# Patient Record
Sex: Female | Born: 2000 | Hispanic: No | Marital: Single | Smoking: Never smoker
Health system: Southern US, Community
[De-identification: ages and names within clinical notes are randomized; demographics above are authoritative.]

## PROBLEM LIST (undated history)

## (undated) HISTORY — PX: NO PAST SURGERIES: SHX2092

---
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
## 2023-08-26 ENCOUNTER — Ambulatory Visit: Admission: EM | Admit: 2023-08-26 | Payer: Self-pay | Source: Home / Self Care

## 2023-10-18 ENCOUNTER — Ambulatory Visit
Admission: EM | Admit: 2023-10-18 | Discharge: 2023-10-18 | Disposition: A | Discharge status: Home / Self Care | Payer: Self-pay | Attending: Physician Assistant | Admitting: Physician Assistant

## 2023-10-18 DIAGNOSIS — J069 Acute upper respiratory infection, unspecified: Secondary | ICD-10-CM

## 2023-10-18 MED ORDER — PROMETHAZINE-DM 6.25-15 MG/5ML PO SYRP: 5.0000 mL | ORAL_SOLUTION | Freq: Four times a day (QID) | ORAL | 0 refills | Status: DC | PRN

## 2023-10-18 NOTE — ED Provider Notes (Signed)
[facility]    CSN: [id number] Arrival date & time: 10/18/23  1354      History   Chief Complaint Chief Complaint  Patient presents with   Cough   Congestion    HPI Natasha Sandoval is a 22 y.o. female presenting for approximately 5-day history of fatigue, cough, congestion and slight sore throat.  Reports a fever the first day but none since.  Denies ear pain, sinus pain, chest pain, wheezing, shortness of breath, weakness, vomiting, abdominal pain, diarrhea.  No sick contacts.  Has been taking DayQuil and NyQuil without relief.  Reports feeling the same as when her symptoms began.  No better or worse.  No history of asthma.  No other complaints.  HPI  History reviewed. No pertinent past medical history.  There are no problems to display for this patient.   Past Surgical History:  Procedure Laterality Date   NO PAST SURGERIES      OB History   No obstetric history on file.      Home Medications    Prior to Admission medications   Medication Sig Start Date End Date Taking? Authorizing Provider  promethazine-dextromethorphan (PROMETHAZINE-DM) 6.25-15 MG/5ML syrup Take 5 mLs by mouth 4 (four) times daily as needed. 10/18/23  Yes Shirlee Latch PA-C    Family History History reviewed. No pertinent family history.  Social History Social History   Tobacco Use   Smoking status: Never   Smokeless tobacco: Never  Vaping Use   Vaping status: Never Used  Substance Use Topics   Alcohol use: Never   Drug use: Never     Allergies   Patient has no known allergies.   Review of Systems Review of Systems  Constitutional:  Positive for fatigue. Negative for chills, diaphoresis and fever.  HENT:  Positive for congestion, rhinorrhea and sore throat. Negative for ear pain, sinus pressure and sinus pain.   Respiratory:  Positive for cough. Negative for shortness of breath.   Cardiovascular:  Negative for chest pain.  Gastrointestinal:  Negative for  abdominal pain, nausea and vomiting.  Musculoskeletal:  Negative for arthralgias and myalgias.  Skin:  Negative for rash.  Neurological:  Negative for weakness and headaches.  Hematological:  Negative for adenopathy.     Physical Exam Triage Vital Signs ED Triage Vitals  Encounter Vitals Group     BP 10/18/23 1456 93/65     Systolic BP Percentile --      Diastolic BP Percentile --      Pulse Rate 10/18/23 1456 75     Resp 10/18/23 1456 16     Temp 10/18/23 1456 98.6 F (37 C)     Temp Source 10/18/23 1456 Oral     SpO2 10/18/23 1456 95 %     Weight 10/18/23 1456 100 lb (45.4 kg)     Height 10/18/23 1456 4\' 10"  (1.473 m)     Head Circumference --      Peak Flow --      Pain Score 10/18/23 1458 0     Pain Loc --      Pain Education --      Exclude from Growth Chart --    No data found.  Updated Vital Signs BP 93/65 (BP Location: Left Arm)   Pulse 75   Temp 98.6 F (37 C) (Oral)   Resp 16   Ht 4\' 10"  (1.473 m)   Wt 100 lb (45.4 kg)   LMP 09/28/2023 (Approximate)   SpO2 95%  BMI 20.90 kg/m      Physical Exam Vitals and nursing note reviewed.  Constitutional:      General: She is not in acute distress.    Appearance: Normal appearance. She is not ill-appearing or toxic-appearing.  HENT:     Head: Normocephalic and atraumatic.     Right Ear: Tympanic membrane, ear canal and external ear normal.     Left Ear: Tympanic membrane, ear canal and external ear normal.     Nose: Congestion present.     Mouth/Throat:     Mouth: Mucous membranes are moist.     Pharynx: Oropharynx is clear.  Eyes:     General: No scleral icterus.       Right eye: No discharge.        Left eye: No discharge.     Conjunctiva/sclera: Conjunctivae normal.  Cardiovascular:     Rate and Rhythm: Normal rate and regular rhythm.     Heart sounds: Normal heart sounds.  Pulmonary:     Effort: Pulmonary effort is normal. No respiratory distress.     Breath sounds: Normal breath sounds. No  wheezing, rhonchi or rales.  Musculoskeletal:     Cervical back: Neck supple.  Skin:    General: Skin is dry.  Neurological:     General: No focal deficit present.     Mental Status: She is alert. Mental status is at baseline.     Motor: No weakness.     Gait: Gait normal.  Psychiatric:        Mood and Affect: Mood normal.        Behavior: Behavior normal.        Thought Content: Thought content normal.      UC Treatments / Results  Labs (all labs ordered are listed, but only abnormal results are displayed) Labs Reviewed - No data to display  EKG   Radiology No results found.  Procedures Procedures (including critical care time)  Medications Ordered in UC Medications - No data to display  Initial Impression / Assessment and Plan / UC Course  I have reviewed the triage vital signs and the nursing notes.  Pertinent labs & imaging results that were available during my care of the patient were reviewed by me and considered in my medical decision making (see chart for details).   22 year old female presents for 5-day history of cough, congestion and mild sore throat.  Denies fever, sinus pain, ear pain, shortness of breath.  Vitals normal and stable.  Patient overall well-appearing.  Slight nasal congestion.  No ear infection.  Throat is clear.  Chest clear to auscultation in 4 listening fields.  Low suspicion for bronchitis or pneumonia.  Symptoms consistent with viral upper respiratory infection.  Advised that symptoms can last for a couple of weeks.  Advised that she should return if no improvement after 10 days, fever, sinus pain, ear pain, worsening sore throat, chest pain, shortness of breath.  Sent Promethazine DM to pharmacy and advised for care.   Final Clinical Impressions(s) / UC Diagnoses   Final diagnoses:  Viral URI with cough     Discharge Instructions      URI/COLD SYMPTOMS: Your exam today is consistent with a viral illness. Antibiotics are not  indicated at this time. Use medications as directed, including cough syrup, nasal saline, and decongestants. Your symptoms should improve over the next few days and resolve within 7-10 days. Increase rest and fluids. F/u if symptoms worsen or predominate such as sore  throat, ear pain, productive cough, shortness of breath, or if you develop high fevers or worsening fatigue over the next several days.       ED Prescriptions     Medication Sig Dispense Auth. Provider   promethazine-dextromethorphan (PROMETHAZINE-DM) 6.25-15 MG/5ML syrup Take 5 mLs by mouth 4 (four) times daily as needed. 118 mL Shirlee Latch, PA-C      PDMP not reviewed this encounter.   Shirlee Latch, PA-C 10/18/23 1605

## 2023-10-18 NOTE — ED Triage Notes (Signed)
Pt c/o cough & congestion x5 days. Denies any fevers. Has tried dayquil & nyquil w/o relief. Denies any SOB.

## 2023-10-18 NOTE — Discharge Instructions (Signed)
URI/COLD SYMPTOMS: Your exam today is consistent with a viral illness. Antibiotics are not indicated at this time. Use medications as directed, including cough syrup, nasal saline, and decongestants. Your symptoms should improve over the next few days and resolve within 7-10 days. Increase rest and fluids. F/u if symptoms worsen or predominate such as sore throat, ear pain, productive cough, shortness of breath, or if you develop high fevers or worsening fatigue over the next several days.    

## 2023-10-22 ENCOUNTER — Ambulatory Visit
Admission: EM | Admit: 2023-10-22 | Discharge: 2023-10-22 | Disposition: A | Discharge status: Home / Self Care | Payer: Self-pay | Attending: Emergency Medicine | Admitting: Emergency Medicine

## 2023-10-22 ENCOUNTER — Encounter: Payer: Self-pay | Admitting: Emergency Medicine

## 2023-10-22 DIAGNOSIS — J209 Acute bronchitis, unspecified: Secondary | ICD-10-CM

## 2023-10-22 MED ORDER — FLUTICASONE PROPIONATE 50 MCG/ACT NA SUSP: 1.0000 | Freq: Two times a day (BID) | NASAL | 1 refills | Status: AC

## 2023-10-22 MED ORDER — PREDNISONE 10 MG PO TABS: 10.0000 mg | ORAL_TABLET | Freq: Every day | ORAL | 0 refills | Status: DC

## 2023-10-22 MED ORDER — BENZONATATE 100 MG PO CAPS: 100.0000 mg | ORAL_CAPSULE | Freq: Three times a day (TID) | ORAL | 0 refills | Status: DC | PRN

## 2023-10-22 NOTE — ED Provider Notes (Signed)
[facility]    CSN: [id number] Arrival date & time: 10/22/23  1552      History   Chief Complaint Chief Complaint  Patient presents with   Nasal Congestion   Cough    HPI Natasha Sandoval is a 22 y.o. female.   Patient was seen on the 21st.  She was diagnosed with cold cough.  She continues with mucus production and excessive cough.   Cough   History reviewed. No pertinent past medical history.  There are no problems to display for this patient.   Past Surgical History:  Procedure Laterality Date   NO PAST SURGERIES      OB History   No obstetric history on file.      Home Medications    Prior to Admission medications   Medication Sig Start Date End Date Taking? Authorizing Provider  promethazine-dextromethorphan (PROMETHAZINE-DM) 6.25-15 MG/5ML syrup Take 5 mLs by mouth 4 (four) times daily as needed. 10/18/23   Shirlee Latch, PA-C    Family History History reviewed. No pertinent family history.  Social History Social History   Tobacco Use   Smoking status: Never   Smokeless tobacco: Never  Vaping Use   Vaping status: Never Used  Substance Use Topics   Alcohol use: Never   Drug use: Never     Allergies   Patient has no known allergies.   Review of Systems Review of Systems  Respiratory:  Positive for cough.      Physical Exam Triage Vital Signs ED Triage Vitals  Encounter Vitals Group     BP 10/22/23 1617 107/62     Systolic BP Percentile --      Diastolic BP Percentile --      Pulse Rate 10/22/23 1617 71     Resp 10/22/23 1617 14     Temp 10/22/23 1617 98.4 F (36.9 C)     Temp Source 10/22/23 1617 Oral     SpO2 10/22/23 1617 99 %     Weight 10/22/23 1616 100 lb 1.4 oz (45.4 kg)     Height 10/22/23 1616 4\' 10"  (1.473 m)     Head Circumference --      Peak Flow --      Pain Score 10/22/23 1616 0     Pain Loc --      Pain Education --      Exclude from Growth Chart --    No data found.  Updated Vital  Signs BP 107/62 (BP Location: Left Arm)   Pulse 71   Temp 98.4 F (36.9 C) (Oral)   Resp 14   Ht 4\' 10"  (1.473 m)   Wt 100 lb 1.4 oz (45.4 kg)   LMP 09/28/2023 (Approximate)   SpO2 99%   BMI 20.92 kg/m   Visual Acuity Right Eye Distance:   Left Eye Distance:   Bilateral Distance:    Right Eye Near:   Left Eye Near:    Bilateral Near:     Physical Exam   UC Treatments / Results  Labs (all labs ordered are listed, but only abnormal results are displayed) Labs Reviewed - No data to display  EKG   Radiology No results found.  Procedures Procedures (including critical care time)  Medications Ordered in UC Medications - No data to display  Initial Impression / Assessment and Plan / UC Course  I have reviewed the triage vital signs and the nursing notes.  Pertinent labs & imaging results that were available during my  care of the patient were reviewed by me and considered in my medical decision making (see chart for details). Symptoms consistent with bronchitis, excessive mucus production also noted.  Will treat patient with steroids Mucinex and Tessalon Perle for cough.    *** Final Clinical Impressions(s) / UC Diagnoses   Final diagnoses:  None   Discharge Instructions   None    ED Prescriptions   None    PDMP not reviewed this encounter.

## 2023-10-22 NOTE — ED Triage Notes (Signed)
Patient c/o cough, chest congestion and nasal congestion that started 10 days ago.  Patient denies recent fevers. Patient was seen here on 10/18/23.

## 2023-10-22 NOTE — Discharge Instructions (Addendum)
Take medications as prescribed.   Continue with Mucinex 600 mg daily. Return to clinic for signs of infection such as fever sinus pressure pain or worsening of symptoms. Cough may last up to 3 weeks.

## 2024-11-06 ENCOUNTER — Ambulatory Visit
Admission: EM | Admit: 2024-11-06 | Discharge: 2024-11-06 | Disposition: A | Discharge status: Home / Self Care | Payer: Self-pay | Attending: Family Medicine | Admitting: Family Medicine

## 2024-11-06 DIAGNOSIS — F909 Attention-deficit hyperactivity disorder, unspecified type: Secondary | ICD-10-CM | POA: Insufficient documentation

## 2024-11-06 DIAGNOSIS — B379 Candidiasis, unspecified: Secondary | ICD-10-CM | POA: Insufficient documentation

## 2024-11-06 DIAGNOSIS — R300 Dysuria: Secondary | ICD-10-CM | POA: Insufficient documentation

## 2024-11-06 DIAGNOSIS — N898 Other specified noninflammatory disorders of vagina: Secondary | ICD-10-CM | POA: Insufficient documentation

## 2024-11-06 LAB — POCT URINE DIPSTICK
Bilirubin, UA: NEGATIVE
Blood, UA: NEGATIVE
Glucose, UA: NEGATIVE mg/dL
Ketones, POC UA: NEGATIVE mg/dL
Leukocytes, UA: NEGATIVE
Nitrite, UA: NEGATIVE
Protein Ur, POC: NEGATIVE mg/dL
Spec Grav, UA: 1.02 (ref 1.010–1.025)
Urobilinogen, UA: 0.2 U/dL
pH, UA: 7.5 (ref 5.0–8.0)

## 2024-11-06 NOTE — ED Triage Notes (Signed)
 Pt c/o urinary freq & cloudy urine x2 wks. Denies any burning,pain or hematuria. Has tried AZO w/o relief.

## 2024-11-06 NOTE — ED Provider Notes (Signed)
 [facility]    CSN: [id number] Arrival date & time: 11/06/24  1117      History   Chief Complaint Chief Complaint  Patient presents with   Dysuria     HPI HPI Natasha Sandoval is a 23 y.o. female.    Natasha Sandoval presents for cloudy urine and urinary frequency with small amounts of urine that started 2 weeks ago. Has uncomfortable urination.   Tried AZO prior to arrival.  Has not had any antibiotics in last 30 days.   Denies known STI exposure.  Natasha Sandoval does use condoms regularly. She is  not currently pregnant.  Patient's last menstrual period was 10/23/2024 (approximate).    - Abnormal vaginal discharge: no - vaginal odor: no - vaginal bleeding: no - Dysuria: uncomfortable  - Hematuria: no - Urinary urgency: yes  - Urinary frequency: yes  - Fever: no - Abdominal pain: no  - Pelvic pain: no - Rash/Skin lesions/mouth ulcers: no - Nausea: no  - Vomiting: no  - Back Pain: no        History reviewed. No pertinent past medical history.  Patient Active Problem List   Diagnosis Date Noted   ADHD (attention deficit hyperactivity disorder) 11/06/2024   Short stature 03/27/2014    Past Surgical History:  Procedure Laterality Date   NO PAST SURGERIES      OB History   No obstetric history on file.      Home Medications    Prior to Admission medications   Medication Sig Start Date End Date Taking? Authorizing Provider  fluticasone  (FLONASE ) 50 MCG/ACT nasal spray Place 1 spray into both nostrils in the morning and at bedtime. 10/22/23   Blitch, Marval HERO, NP    Family History History reviewed. No pertinent family history.  Social History Social History   Tobacco Use   Smoking status: Never   Smokeless tobacco: Never  Vaping Use   Vaping status: Never Used  Substance Use Topics   Alcohol use: Never   Drug use: Never     Allergies   Patient has no known allergies.   Review of Systems Review of Systems: :negative unless  otherwise stated in HPI.      Physical Exam Triage Vital Signs ED Triage Vitals  Encounter Vitals Group     BP 11/06/24 1137 93/60     Girls Systolic BP Percentile --      Girls Diastolic BP Percentile --      Boys Systolic BP Percentile --      Boys Diastolic BP Percentile --      Pulse Rate 11/06/24 1137 78     Resp --      Temp 11/06/24 1137 98.6 F (37 C)     Temp Source 11/06/24 1137 Oral     SpO2 11/06/24 1137 97 %     Weight 11/06/24 1135 103 lb 9.6 oz (47 kg)     Height --      Head Circumference --      Peak Flow --      Pain Score 11/06/24 1136 0     Pain Loc --      Pain Education --      Exclude from Growth Chart --    No data found.  Updated Vital Signs BP 93/60 (BP Location: Right Arm)   Pulse 78   Temp 98.6 F (37 C) (Oral)   Wt 47 kg   LMP 10/23/2024 (Approximate)   SpO2 97%   BMI 21.65  kg/m   Visual Acuity Right Eye Distance:   Left Eye Distance:   Bilateral Distance:    Right Eye Near:   Left Eye Near:    Bilateral Near:     Physical Exam GEN: well appearing female in no acute distress  CVS: well perfused  RESP: speaking in full sentences without pause  ABD: soft, non-tender, non-distended, no palpable masses, no CVA tenderness   GU: deferred, patient performed self swab     UC Treatments / Results  Labs (all labs ordered are listed, but only abnormal results are displayed) Labs Reviewed  POCT URINE DIPSTICK - Normal  URINE CULTURE  CERVICOVAGINAL ANCILLARY ONLY    EKG   Radiology No results found.  Procedures Procedures (including critical care time)  Medications Ordered in UC Medications - No data to display  Initial Impression / Assessment and Plan / UC Course  I have reviewed the triage vital signs and the nursing notes.  Pertinent labs & imaging results that were available during my care of the patient were reviewed by me and considered in my medical decision making (see chart for details).      Patient is  a 23 y.o.Natasha Sandoval female  who presents for urinary discomfort with urinary frequency and malodorous GU smell.  Overall patient is well-appearing and afebrile.  Vital signs stable.  POC urine dipstick not consistent with acute cystitis.   Treatment deferred. Urine culture obtained.  Follow-up sensitivities and change antibiotics, if needed.  Vaginal swab for yeast vaginitis, bacterial vaginitis, trichomonas, gonorrhea and chlamydia obtained.  Patient does not request prophylactic STI treatment.  Return precautions including abdominal pain, fever, chills, nausea, or vomiting given. Discussed MDM, treatment plan and plan for follow-up with patient who agrees with plan.     Final Clinical Impressions(s) / UC Diagnoses   Final diagnoses:  Vaginal odor  Dysuria     Discharge Instructions      You did not have evidence of a urinary tract infection. I sent your urine for culture to be sure. Someone may call you to stop/change antibiotics based off your culture results. Stop by the pharmacy to pick up your prescriptions.  Follow up with your primary care provider or return to the urgent care, if not improving.    Your vaginal swab test results will be available in the next 24-72 hours. If positive, someone will contact you.  You should see your results in your MyChart account.       ED Prescriptions   None    PDMP not reviewed this encounter.   Kriste Berth, DO 11/06/24 1228

## 2024-11-06 NOTE — Discharge Instructions (Addendum)
 You did not have evidence of a urinary tract infection. I sent your urine for culture to be sure. Someone may call you to stop/change antibiotics based off your culture results. Stop by the pharmacy to pick up your prescriptions.  Follow up with your primary care provider or return to the urgent care, if not improving.    Your vaginal swab test results will be available in the next 24-72 hours. If positive, someone will contact you.  You should see your results in your MyChart account.

## 2024-11-07 ENCOUNTER — Ambulatory Visit (HOSPITAL_COMMUNITY): Payer: Self-pay

## 2024-11-07 LAB — CERVICOVAGINAL ANCILLARY ONLY
Bacterial Vaginitis (gardnerella): NEGATIVE
Candida Glabrata: NEGATIVE
Candida Vaginitis: POSITIVE — AB
Chlamydia: NEGATIVE
Comment: NEGATIVE
Comment: NEGATIVE
Comment: NEGATIVE
Comment: NEGATIVE
Comment: NEGATIVE
Comment: NORMAL
Neisseria Gonorrhea: NEGATIVE
Trichomonas: NEGATIVE

## 2024-11-07 LAB — URINE CULTURE: Culture: NO GROWTH

## 2024-11-07 MED ORDER — FLUCONAZOLE 150 MG PO TABS: 150.0000 mg | ORAL_TABLET | Freq: Once | ORAL | 0 refills | Status: AC

## 2024-11-29 ENCOUNTER — Ambulatory Visit
Admission: EM | Admit: 2024-11-29 | Discharge: 2024-11-29 | Disposition: A | Discharge status: Home / Self Care | Payer: Self-pay | Attending: Emergency Medicine | Admitting: Emergency Medicine

## 2024-11-29 DIAGNOSIS — J069 Acute upper respiratory infection, unspecified: Secondary | ICD-10-CM

## 2024-11-29 MED ORDER — AMOXICILLIN-POT CLAVULANATE 875-125 MG PO TABS: 1.0000 | ORAL_TABLET | Freq: Two times a day (BID) | ORAL | 0 refills | Status: AC

## 2024-11-29 MED ORDER — BENZONATATE 100 MG PO CAPS: 200.0000 mg | ORAL_CAPSULE | Freq: Three times a day (TID) | ORAL | 0 refills | Status: AC

## 2024-11-29 MED ORDER — IPRATROPIUM BROMIDE 0.06 % NA SOLN: 2.0000 | Freq: Four times a day (QID) | NASAL | 12 refills | Status: AC

## 2024-11-29 MED ORDER — PROMETHAZINE-DM 6.25-15 MG/5ML PO SYRP: 5.0000 mL | ORAL_SOLUTION | Freq: Four times a day (QID) | ORAL | 0 refills | Status: AC | PRN

## 2024-11-29 NOTE — Discharge Instructions (Signed)
 Take the Augmentin twice daily with food for 7 days for treatment of your upper respiratory tract infection.  Please use over-the-counter Tylenol and/or ibuprofen as needed for any fever or pain.  Use the Atrovent nasal spray, 2 squirts in each nostril every 6 hours, as needed for runny nose and postnasal drip.  Use the Tessalon  Perles every 8 hours during the day.  Take them with a small sip of water.  They may give you some numbness to the base of your tongue or a metallic taste in your mouth, this is normal.  Use the Promethazine  DM cough syrup at bedtime for cough and congestion.  It will make you drowsy so do not take it during the day.  Return for reevaluation or see your primary care provider for any new or worsening symptoms.

## 2024-11-29 NOTE — ED Triage Notes (Signed)
 Pt c/o cough & wheezing x1 wk. Has tried OTC meds w/o relief. Denies any hx of asthma.

## 2024-11-29 NOTE — ED Provider Notes (Signed)
 [facility]    CSN: [id number] Arrival date & time: 11/29/24  0855      History   Chief Complaint Chief Complaint  Patient presents with   Cough    HPI Natasha Sandoval is a 23 y.o. female.   HPI  23 year old female with past medical history significant for ADHD presents for evaluation of a week and a half with respiratory symptoms that include runny nose and nasal congestion for clear nasal discharge, nonproductive cough, and some wheezing.  She denies any fever, ear pain, sore throat, shortness of breath.  Her family has had similar symptoms.  History reviewed. No pertinent past medical history.  Patient Active Problem List   Diagnosis Date Noted   ADHD (attention deficit hyperactivity disorder) 11/06/2024   Short stature 03/27/2014    Past Surgical History:  Procedure Laterality Date   NO PAST SURGERIES      OB History   No obstetric history on file.      Home Medications    Prior to Admission medications   Medication Sig Start Date End Date Taking? Authorizing Provider  amoxicillin-clavulanate (AUGMENTIN) 875-125 MG tablet Take 1 tablet by mouth every 12 (twelve) hours for 7 days. 11/29/24 12/06/24 Yes Bernardino Ditch, NP  benzonatate  (TESSALON ) 100 MG capsule Take 2 capsules (200 mg total) by mouth every 8 (eight) hours. 11/29/24  Yes Bernardino Ditch, NP  ipratropium (ATROVENT) 0.06 % nasal spray Place 2 sprays into both nostrils 4 (four) times daily. 11/29/24  Yes Bernardino Ditch, NP  promethazine -dextromethorphan (PROMETHAZINE -DM) 6.25-15 MG/5ML syrup Take 5 mLs by mouth 4 (four) times daily as needed. 11/29/24  Yes Bernardino Ditch, NP  fluticasone  (FLONASE ) 50 MCG/ACT nasal spray Place 1 spray into both nostrils in the morning and at bedtime. 10/22/23   Blitch, Marval HERO, NP    Family History History reviewed. No pertinent family history.  Social History Social History   Tobacco Use   Smoking status: Never   Smokeless tobacco: Never  Vaping Use   Vaping  status: Never Used  Substance Use Topics   Alcohol use: Never   Drug use: Never     Allergies   Patient has no known allergies.   Review of Systems Review of Systems  Constitutional:  Negative for fever.  HENT:  Positive for congestion and rhinorrhea. Negative for ear pain and sore throat.   Respiratory:  Positive for cough and wheezing. Negative for shortness of breath.      Physical Exam Triage Vital Signs ED Triage Vitals  Encounter Vitals Group     BP      Girls Systolic BP Percentile      Girls Diastolic BP Percentile      Boys Systolic BP Percentile      Boys Diastolic BP Percentile      Pulse      Resp      Temp      Temp src      SpO2      Weight      Height      Head Circumference      Peak Flow      Pain Score      Pain Loc      Pain Education      Exclude from Growth Chart    No data found.  Updated Vital Signs BP 94/61 (BP Location: Right Arm)   Pulse 75   Temp 98.1 F (36.7 C) (Oral)   Resp 16   Wt 104  lb 8 oz (47.4 kg)   LMP 11/17/2024 (Approximate)   SpO2 97%   BMI 21.84 kg/m   Visual Acuity Right Eye Distance:   Left Eye Distance:   Bilateral Distance:    Right Eye Near:   Left Eye Near:    Bilateral Near:     Physical Exam Vitals and nursing note reviewed.  Constitutional:      Appearance: Normal appearance. She is not ill-appearing.  HENT:     Head: Normocephalic and atraumatic.     Right Ear: Tympanic membrane, ear canal and external ear normal. There is no impacted cerumen.     Left Ear: Tympanic membrane, ear canal and external ear normal. There is no impacted cerumen.     Nose: Congestion and rhinorrhea present.     Comments: His mucosa is edematous and erythematous with clear discharge in both ears.    Mouth/Throat:     Mouth: Mucous membranes are moist.     Pharynx: Oropharynx is clear. No oropharyngeal exudate or posterior oropharyngeal erythema.  Cardiovascular:     Rate and Rhythm: Normal rate and regular  rhythm.     Pulses: Normal pulses.     Heart sounds: Normal heart sounds. No murmur heard.    No friction rub. No gallop.  Pulmonary:     Effort: Pulmonary effort is normal.     Breath sounds: Normal breath sounds. No wheezing, rhonchi or rales.  Musculoskeletal:     Cervical back: Normal range of motion and neck supple. No tenderness.  Lymphadenopathy:     Cervical: No cervical adenopathy.  Skin:    General: Skin is warm and dry.     Capillary Refill: Capillary refill takes less than 2 seconds.     Findings: No erythema or rash.  Neurological:     General: No focal deficit present.     Mental Status: She is alert and oriented to person, place, and time.      UC Treatments / Results  Labs (all labs ordered are listed, but only abnormal results are displayed) Labs Reviewed - No data to display  EKG   Radiology No results found.  Procedures Procedures (including critical care time)  Medications Ordered in UC Medications - No data to display  Initial Impression / Assessment and Plan / UC Course  I have reviewed the triage vital signs and the nursing notes.  Pertinent labs & imaging results that were available during my care of the patient were reviewed by me and considered in my medical decision making (see chart for details).   Patient is a pleasant, nontoxic-appearing 23 year old female presenting for evaluation of week and a half without respiratory symptoms.  She is reporting that she has had some wheezing though in the exam room she does not demonstrate any wheezing and her lungs are clear to auscultation in all fields.  She does have inflammation of her upper respiratory tract as evidenced by inflamed nasal mucosa with clear nasal discharge.  Oropharyngeal exam is benign.  Given that the patient has had symptoms for a week and a half I will not do any viral testing at this time.  Because of the duration of symptoms I do think a trial of antibiotics is warranted so I  will start her on Augmentin 875 mg twice daily with food for 7 days.  I will also prescribe Atrovent nasal spray for her nasal congestion and Tessalon  Perles and Promethazine  DM cough syrup for cough and congestion.  Return precautions reviewed.  School note provided.   Final Clinical Impressions(s) / UC Diagnoses   Final diagnoses:  URI with cough and congestion     Discharge Instructions      Take the Augmentin twice daily with food for 7 days for treatment of your upper respiratory tract infection.  Please use over-the-counter Tylenol and/or ibuprofen as needed for any fever or pain.  Use the Atrovent nasal spray, 2 squirts in each nostril every 6 hours, as needed for runny nose and postnasal drip.  Use the Tessalon  Perles every 8 hours during the day.  Take them with a small sip of water.  They may give you some numbness to the base of your tongue or a metallic taste in your mouth, this is normal.  Use the Promethazine  DM cough syrup at bedtime for cough and congestion.  It will make you drowsy so do not take it during the day.  Return for reevaluation or see your primary care provider for any new or worsening symptoms.      ED Prescriptions     Medication Sig Dispense Auth. Provider   amoxicillin-clavulanate (AUGMENTIN) 875-125 MG tablet Take 1 tablet by mouth every 12 (twelve) hours for 7 days. 14 tablet Bernardino Ditch, NP   benzonatate  (TESSALON ) 100 MG capsule Take 2 capsules (200 mg total) by mouth every 8 (eight) hours. 21 capsule Bernardino Ditch, NP   ipratropium (ATROVENT) 0.06 % nasal spray Place 2 sprays into both nostrils 4 (four) times daily. 15 mL Bernardino Ditch, NP   promethazine -dextromethorphan (PROMETHAZINE -DM) 6.25-15 MG/5ML syrup Take 5 mLs by mouth 4 (four) times daily as needed. 118 mL Bernardino Ditch, NP      PDMP not reviewed this encounter.   Bernardino Ditch, NP 11/29/24 [phone]

## 2024-12-15 ENCOUNTER — Encounter: Payer: Self-pay | Admitting: Emergency Medicine

## 2024-12-15 ENCOUNTER — Ambulatory Visit
Admission: EM | Admit: 2024-12-15 | Discharge: 2024-12-15 | Disposition: A | Discharge status: Home / Self Care | Payer: Self-pay | Attending: Emergency Medicine | Admitting: Emergency Medicine

## 2024-12-15 DIAGNOSIS — B379 Candidiasis, unspecified: Secondary | ICD-10-CM | POA: Insufficient documentation

## 2024-12-15 DIAGNOSIS — N390 Urinary tract infection, site not specified: Secondary | ICD-10-CM | POA: Insufficient documentation

## 2024-12-15 DIAGNOSIS — Z3202 Encounter for pregnancy test, result negative: Secondary | ICD-10-CM

## 2024-12-15 DIAGNOSIS — R319 Hematuria, unspecified: Secondary | ICD-10-CM | POA: Insufficient documentation

## 2024-12-15 DIAGNOSIS — R809 Proteinuria, unspecified: Secondary | ICD-10-CM | POA: Insufficient documentation

## 2024-12-15 LAB — POCT URINE DIPSTICK
Glucose, UA: NEGATIVE mg/dL
Ketones, POC UA: NEGATIVE mg/dL
Nitrite, UA: NEGATIVE
Protein Ur, POC: >=300 mg/dL — AB
Spec Grav, UA: >=1.03 — AB
Urobilinogen, UA: 0.2 U/dL
pH, UA: 6

## 2024-12-15 LAB — POCT URINE PREGNANCY: Preg Test, Ur: NEGATIVE

## 2024-12-15 MED ORDER — CEPHALEXIN 500 MG PO CAPS: 500.0000 mg | ORAL_CAPSULE | Freq: Four times a day (QID) | ORAL | 0 refills | Status: AC

## 2024-12-15 MED ORDER — FLUCONAZOLE 150 MG PO TABS: ORAL_TABLET | ORAL | 0 refills | Status: AC

## 2024-12-15 NOTE — Discharge Instructions (Addendum)
 We are treating you for uti/yeast infection. Your pregnancy test is negative Safe sex precautions Please follow up with PCP/urologist if symptoms persist Drink plenty of water Check MyChart for results, if your results are positive and you need additional treatment you will be notified. If you develop fever, nausea, unable to keep your medications down, or worsening symptoms go to the emergency room for further evaluation

## 2024-12-15 NOTE — ED Provider Notes (Signed)
 " [facility]    CSN: [id number] Arrival date & time: 12/15/24  1112      History   Chief Complaint Chief Complaint  Patient presents with   Urinary Frequency    HPI Natasha Sandoval is a 23 y.o. female.   23 year old female, Natasha Sandoval, presents to urgent care for evaluation of urinary frequency that started about 5 days ago.  Patient states last time she felt this like she had a yeast infection.  Patient was recently on antibiotics 11/29/2024. Pt denies any new sexual partner(s). No abdominal pain or nausea.  The history is provided by the patient. No language interpreter was used.    History reviewed. No pertinent past medical history.  Patient Active Problem List   Diagnosis Date Noted   Acute UTI 12/15/2024   Yeast infection 12/15/2024   Proteinuria 12/15/2024   Hematuria 12/15/2024   ADHD (attention deficit hyperactivity disorder) 11/06/2024   Short stature 03/27/2014    Past Surgical History:  Procedure Laterality Date   NO PAST SURGERIES      OB History   No obstetric history on file.      Home Medications    Prior to Admission medications  Medication Sig Start Date End Date Taking? Authorizing Provider  cephALEXin  (KEFLEX ) 500 MG capsule Take 1 capsule (500 mg total) by mouth 4 (four) times daily for 7 days. 12/15/24 12/22/24 Yes Haidynn Almendarez, NP  fluconazole  (DIFLUCAN ) 150 MG tablet Take 1 tab po today,repeat days 3,7 12/15/24  Yes Jan Olano, NP  benzonatate  (TESSALON ) 100 MG capsule Take 2 capsules (200 mg total) by mouth every 8 (eight) hours. 11/29/24   Bernardino Ditch, NP  fluticasone  (FLONASE ) 50 MCG/ACT nasal spray Place 1 spray into both nostrils in the morning and at bedtime. 10/22/23   Blitch, Marval HERO, NP  ipratropium (ATROVENT ) 0.06 % nasal spray Place 2 sprays into both nostrils 4 (four) times daily. 11/29/24   Bernardino Ditch, NP  promethazine -dextromethorphan (PROMETHAZINE -DM) 6.25-15 MG/5ML syrup Take 5 mLs by mouth 4  (four) times daily as needed. 11/29/24   Bernardino Ditch, NP    Family History History reviewed. No pertinent family history.  Social History Social History[1]   Allergies   Patient has no known allergies.   Review of Systems Review of Systems  Constitutional:  Negative for fever.  Gastrointestinal:  Positive for abdominal pain. Negative for nausea and vomiting.  Genitourinary:  Positive for urgency and vaginal discharge. Negative for dysuria.  All other systems reviewed and are negative.    Physical Exam Triage Vital Signs ED Triage Vitals  Encounter Vitals Group     BP      Girls Systolic BP Percentile      Girls Diastolic BP Percentile      Boys Systolic BP Percentile      Boys Diastolic BP Percentile      Pulse      Resp      Temp      Temp src      SpO2      Weight      Height      Head Circumference      Peak Flow      Pain Score      Pain Loc      Pain Education      Exclude from Growth Chart    No data found.  Updated Vital Signs BP 94/64 (BP Location: Right Arm)   Pulse 68   Temp 97.8 F (  36.6 C) (Oral)   Resp 16   Ht 4' 11 (1.499 m)   Wt 104 lb 8 oz (47.4 kg)   LMP 11/17/2024 (Approximate)   SpO2 94%   BMI 21.11 kg/m   Visual Acuity Right Eye Distance:   Left Eye Distance:   Bilateral Distance:    Right Eye Near:   Left Eye Near:    Bilateral Near:     Physical Exam Vitals and nursing note reviewed.  Constitutional:      General: She is not in acute distress.    Appearance: She is well-developed and well-groomed.  HENT:     Head: Normocephalic and atraumatic.  Eyes:     Conjunctiva/sclera: Conjunctivae normal.  Cardiovascular:     Rate and Rhythm: Normal rate and regular rhythm.     Heart sounds: Normal heart sounds. No murmur heard. Pulmonary:     Effort: Pulmonary effort is normal. No respiratory distress.     Breath sounds: Normal breath sounds and air entry.  Abdominal:     Palpations: Abdomen is soft.     Tenderness:  There is no abdominal tenderness.  Genitourinary:    Comments: Deferred pt self swabbed Musculoskeletal:        General: No swelling.     Cervical back: Neck supple.  Skin:    General: Skin is warm and dry.     Capillary Refill: Capillary refill takes less than 2 seconds.  Neurological:     General: No focal deficit present.     Mental Status: She is alert and oriented to person, place, and time.     GCS: GCS eye subscore is 4. GCS verbal subscore is 5. GCS motor subscore is 6.  Psychiatric:        Attention and Perception: Attention normal.        Mood and Affect: Mood normal.        Speech: Speech normal.        Behavior: Behavior normal. Behavior is cooperative.      UC Treatments / Results  Labs (all labs ordered are listed, but only abnormal results are displayed) Labs Reviewed  URINE CULTURE - Abnormal; Notable for the following components:      Result Value   Culture   (*)    Value: 10,000 COLONIES/mL ESCHERICHIA COLI SUSCEPTIBILITIES TO FOLLOW Performed at [facility], [address] [place], [place] [address]    All other components within normal limits  POCT URINE DIPSTICK - Abnormal; Notable for the following components:   Clarity, UA cloudy (*)    Bilirubin, UA small (*)    Spec Grav, UA >=1.030 (*)    Blood, UA moderate (*)    Protein Ur, POC >=300 (*)    Leukocytes, UA Trace (*)    All other components within normal limits  POCT URINE PREGNANCY - Normal  CERVICOVAGINAL ANCILLARY ONLY    EKG   Radiology No results found.  Procedures Procedures (including critical care time)  Medications Ordered in UC Medications - No data to display  Initial Impression / Assessment and Plan / UC Course  I have reviewed the triage vital signs and the nursing notes.  Pertinent labs & imaging results that were available during my care of the patient were reviewed by me and considered in my medical decision making (see chart for details).  Clinical  Course as of 12/16/24 1434  Fri Dec 15, 2024  1358 Preg test is negative, Ua shows: moderate blood, +protein,  trace leukocytes, urine culture pending. Will treat with diflucan  and keflex  for uti [JD]    Clinical Course User Index [JD] Azzie Thiem, Rilla, NP   Discussed exam findings and plan of care with patient, Keflex  and Diflucan  scripted , culture is pending , swabs pending , strict go to ER precautions given.   Patient verbalized understanding to this provider.  Ddx: Acute UTI, proteinuria, hematuria, kidney stone, yeast infection, STI Final Clinical Impressions(s) / UC Diagnoses   Final diagnoses:  Acute UTI  Yeast infection  Proteinuria, unspecified type  Hematuria, unspecified type     Discharge Instructions      We are treating you for uti/yeast infection. Your pregnancy test is negative Safe sex precautions Please follow up with PCP/urologist if symptoms persist Drink plenty of water Check MyChart for results, if your results are positive and you need additional treatment you will be notified. If you develop fever, nausea, unable to keep your medications down, or worsening symptoms go to the emergency room for further evaluation      ED Prescriptions     Medication Sig Dispense Auth. Provider   fluconazole  (DIFLUCAN ) 150 MG tablet Take 1 tab po today,repeat days 3,7 3 tablet Shneur Whittenburg, NP   cephALEXin  (KEFLEX ) 500 MG capsule Take 1 capsule (500 mg total) by mouth 4 (four) times daily for 7 days. 28 capsule Draco Malczewski, NP      PDMP not reviewed this encounter.     [1]  Social History Tobacco Use   Smoking status: Never   Smokeless tobacco: Never  Vaping Use   Vaping status: Never Used  Substance Use Topics   Alcohol use: Yes   Drug use: Never     Denzil Bristol, Rilla, NP 12/16/24 1434  "

## 2024-12-15 NOTE — ED Triage Notes (Signed)
 Pt c/o urinary frequency. Started about 5 days ago. She states last time she felt this way she had a yeast infection. Pt recently taken antibiotics (11/29/24).

## 2024-12-17 LAB — URINE CULTURE
Culture: 10000 — AB
Special Requests: NORMAL

## 2024-12-18 ENCOUNTER — Ambulatory Visit (HOSPITAL_COMMUNITY): Payer: Self-pay

## 2024-12-18 LAB — CERVICOVAGINAL ANCILLARY ONLY
Bacterial Vaginitis (gardnerella): POSITIVE — AB
Candida Glabrata: NEGATIVE
Candida Vaginitis: POSITIVE — AB
Comment: NEGATIVE
Comment: NEGATIVE
Comment: NEGATIVE

## 2024-12-18 MED ORDER — NITROFURANTOIN MONOHYD MACRO 100 MG PO CAPS: 100.0000 mg | ORAL_CAPSULE | Freq: Two times a day (BID) | ORAL | 0 refills | Status: AC
